# Patient Record
Sex: Male | Born: 1970 | Race: White | Hispanic: No
Health system: Southern US, Community
[De-identification: ages and names within clinical notes are randomized; demographics above are authoritative.]

## PROBLEM LIST (undated history)

## (undated) DIAGNOSIS — M109 Gout, unspecified: Secondary | ICD-10-CM

---

## 2005-06-22 ENCOUNTER — Emergency Department (HOSPITAL_COMMUNITY): Admission: EM | Admit: 2005-06-22 | Discharge: 2005-06-22 | Payer: Self-pay | Admitting: Emergency Medicine

## 2006-05-06 IMAGING — CT CT ABDOMEN W/O CM
1 series · 15 of 32 positions shown, 19 images · non-contrast
Comparison: none

CLINICAL DATA: Left flank pain and hematuria.
 ABDOMEN CT WITHOUT CONTRAST ? 06/22/05:
TECHNIQUE: Multidetector CT imaging of the abdomen was performed following the standard protocol without IV contrast.
TECHNIQUE: Multidetector CT imaging of the pelvis was performed following the standard protocol without IV contrast.

[Series 2: stone_wo 5.0 b40f st · axial · 0.70mm/px · z∈[-503,-91]mm · 15 of 115 slices shown, 19 images]
[im 8/115  soft-tissue]
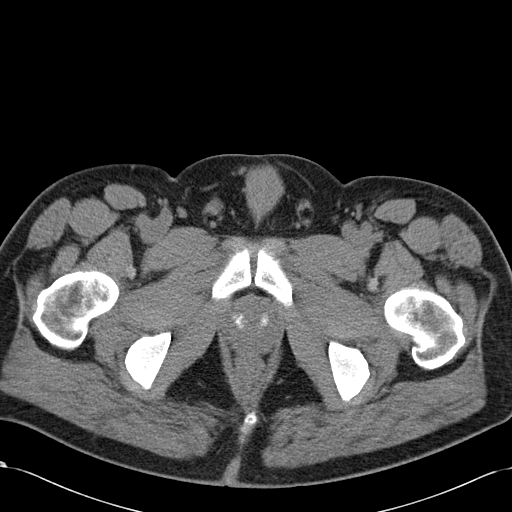
[im 8/115  bone]
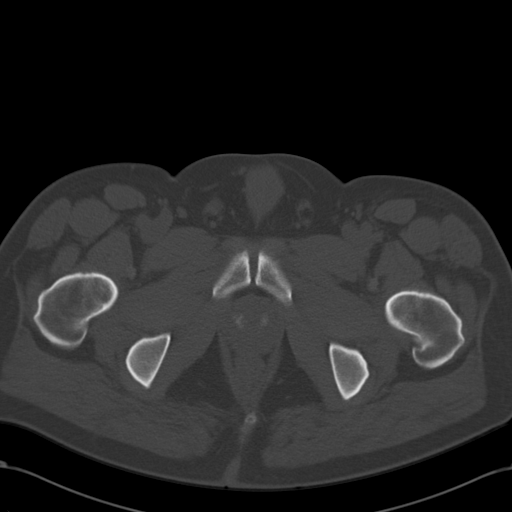
[im 15/115  soft-tissue]
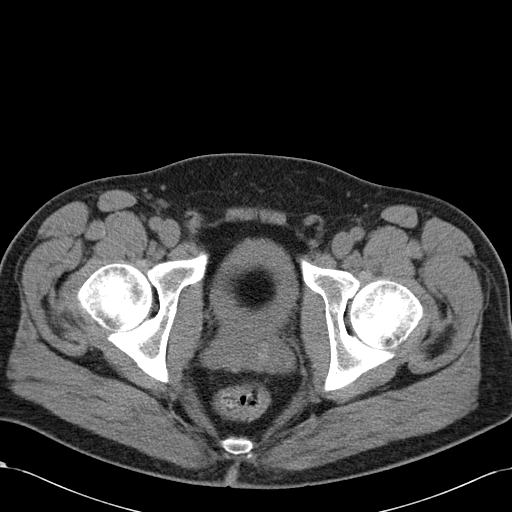
[im 23/115  soft-tissue]
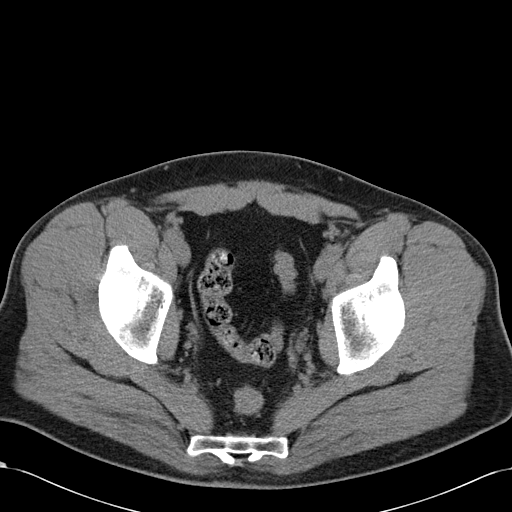
[im 34/115  soft-tissue]
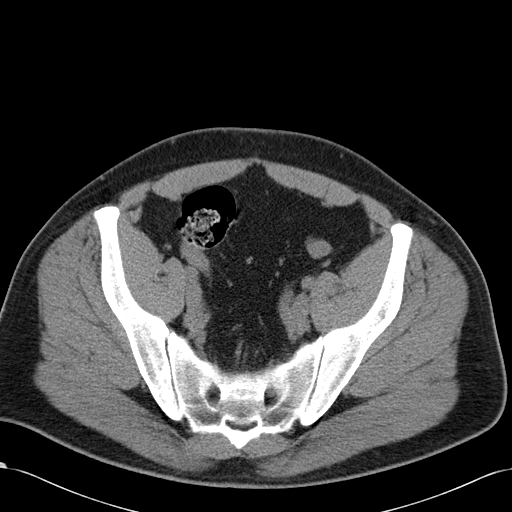
[im 41/115  soft-tissue]
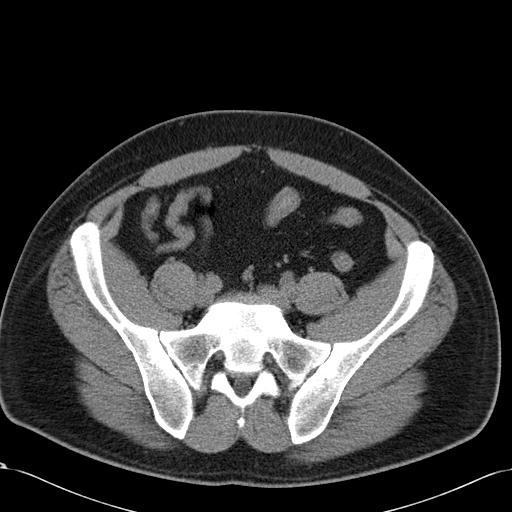
[im 48/115  soft-tissue]
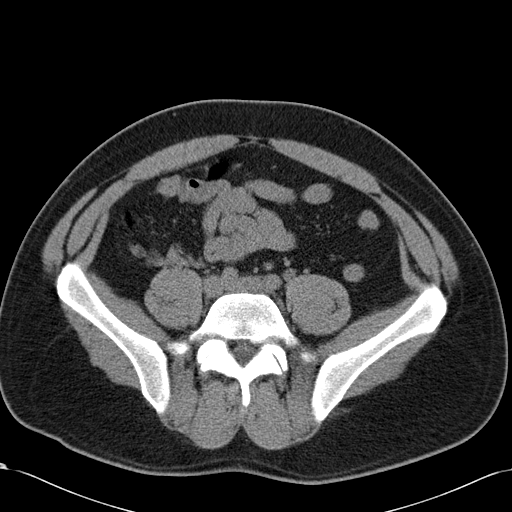
[im 59/115  soft-tissue]
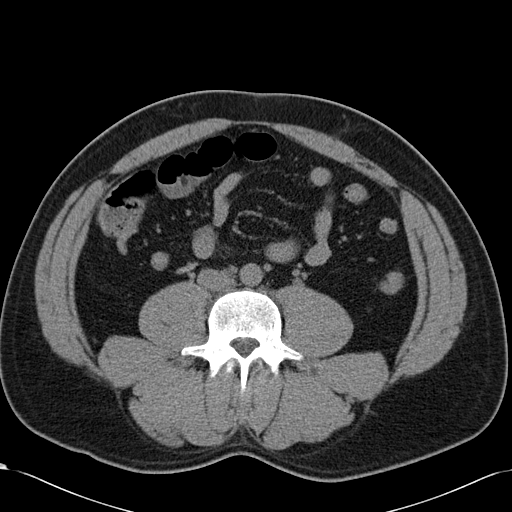
[im 67/115  soft-tissue]
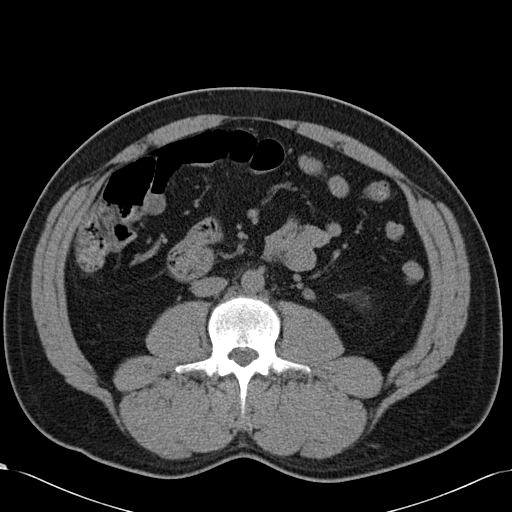
[im 74/115  soft-tissue]
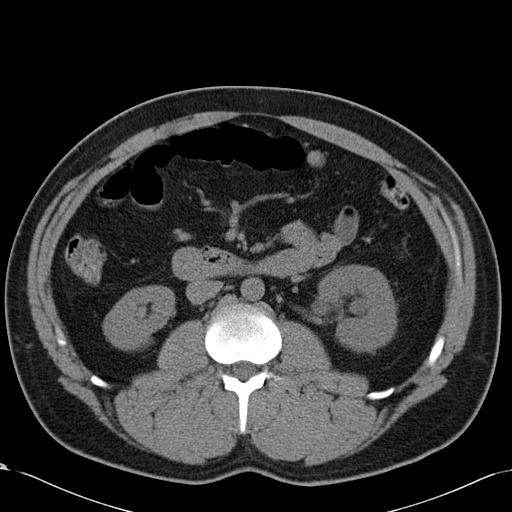
[im 74/115  bone]
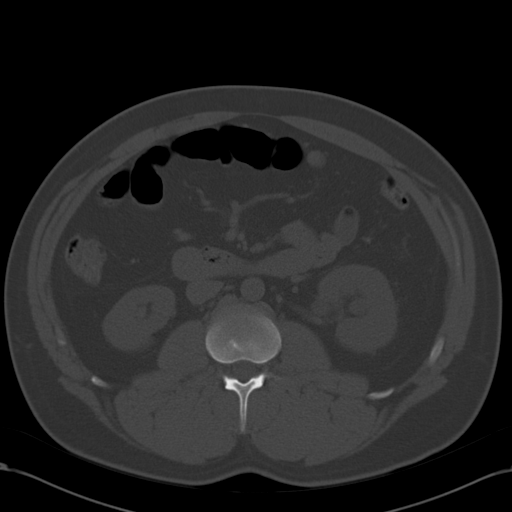
[im 81/115  soft-tissue]
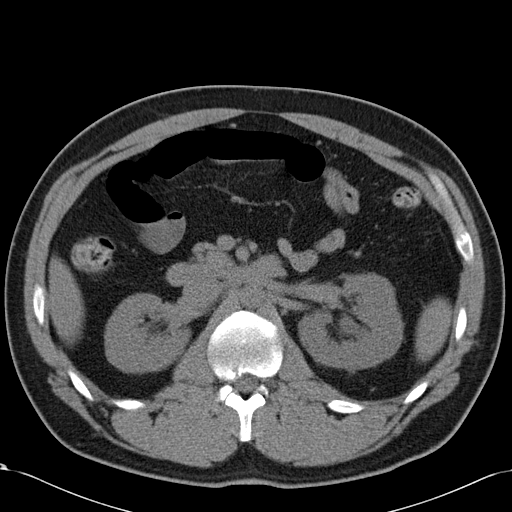
[im 92/115  soft-tissue]
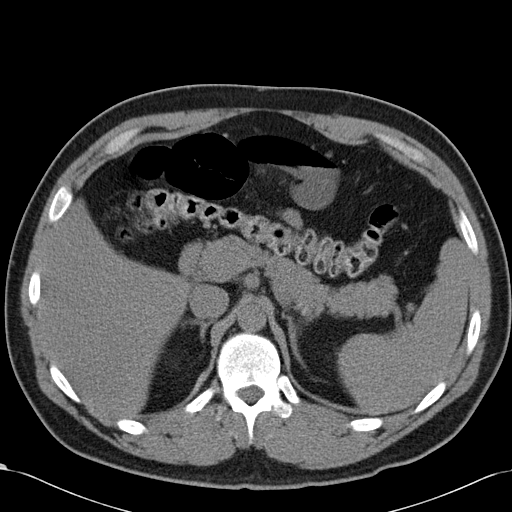
[im 100/115  soft-tissue]
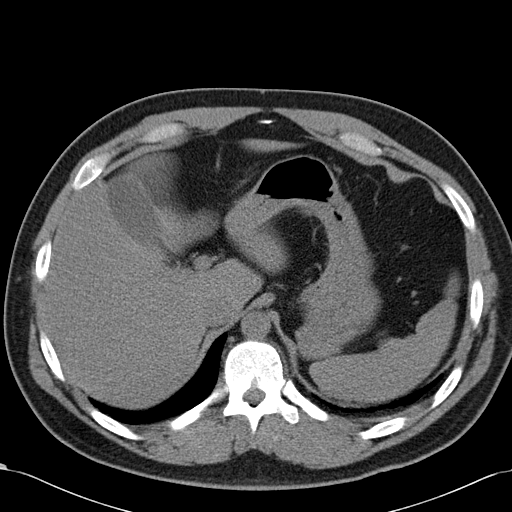
[im 100/115  lung]
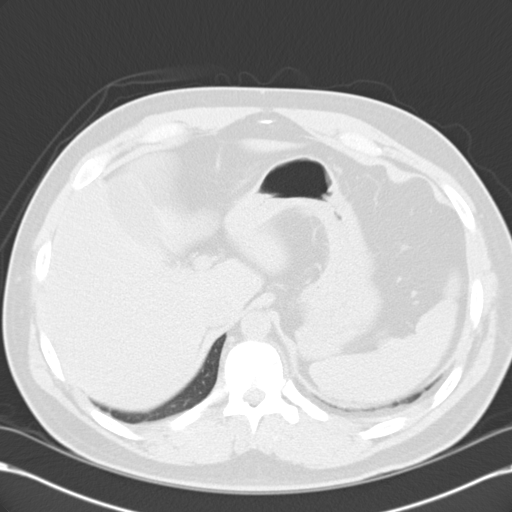
[im 103/115  lung]
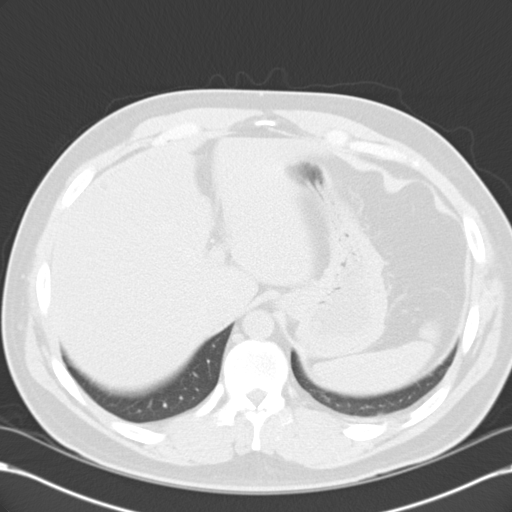
[im 107/115  soft-tissue]
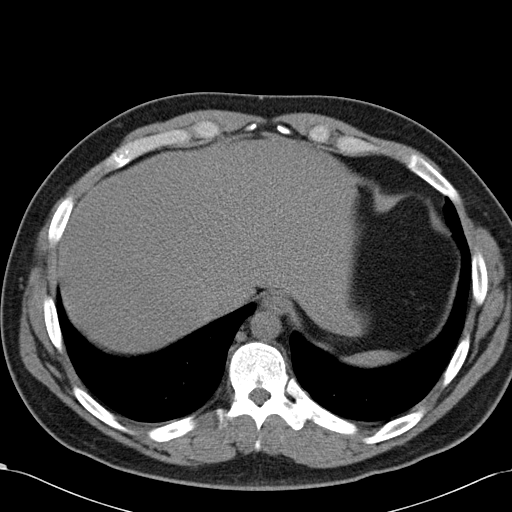
[im 107/115  lung]
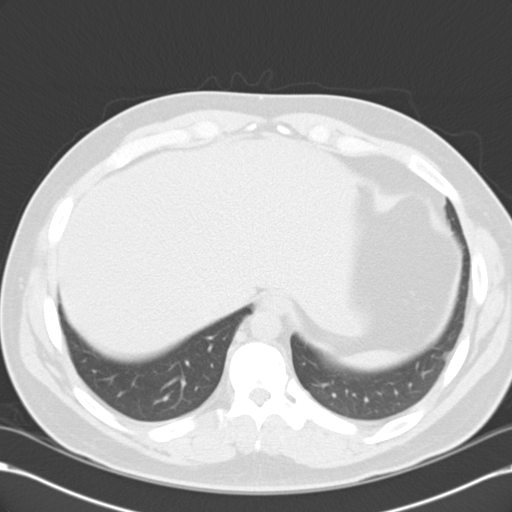
[im 111/115  lung]
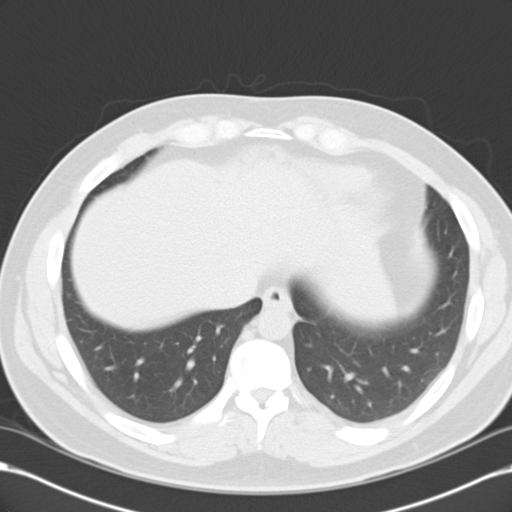

[15 of 32 positions shown; findings below may reference images not displayed]

FINDINGS: The visualized lung bases are clear.  There is no pleural or pericardial effusion.  A focal area of low attenuation adjacent to the gallbladder fossa measures 9.3 mm and is too small to reliably characterize (image #13).
 The spleen is negative. 
 The pancreas is negative.
 The right kidney is negative.
 The left kidney has mild to moderate hydronephrosis.  No nephrolithiasis is identified. 
 Negative for lymphadenopathy.  No free fluid.
 The appendix is negative.
IMPRESSION: Mild to moderate left hydronephrosis.
 PELVIS CT WITHOUT CONTRAST ? 06/22/05:
FINDINGS: A 5.1 mm stone is identified within the distal left ureter just proximal to the ureteropelvic junction.  No free pelvic fluid.  No pelvic lymphadenopathy.
 Review of bone windows demonstrates no lytic or sclerotic lesions.
IMPRESSION: 5.1 mm left distal ureteral stone with mild to moderate left-sided hydronephrosis.

## 2017-08-23 ENCOUNTER — Encounter (HOSPITAL_COMMUNITY): Payer: Self-pay | Admitting: Emergency Medicine

## 2017-08-23 ENCOUNTER — Emergency Department (HOSPITAL_COMMUNITY)
Admission: EM | Admit: 2017-08-23 | Discharge: 2017-08-23 | Disposition: A | Discharge status: Home / Self Care | Payer: Self-pay | Attending: Emergency Medicine | Admitting: Emergency Medicine

## 2017-08-23 ENCOUNTER — Emergency Department (HOSPITAL_COMMUNITY): Payer: Self-pay

## 2017-08-23 DIAGNOSIS — R112 Nausea with vomiting, unspecified: Secondary | ICD-10-CM | POA: Insufficient documentation

## 2017-08-23 DIAGNOSIS — N23 Unspecified renal colic: Secondary | ICD-10-CM | POA: Insufficient documentation

## 2017-08-23 DIAGNOSIS — Z79899 Other long term (current) drug therapy: Secondary | ICD-10-CM | POA: Insufficient documentation

## 2017-08-23 DIAGNOSIS — R7989 Other specified abnormal findings of blood chemistry: Secondary | ICD-10-CM | POA: Insufficient documentation

## 2017-08-23 HISTORY — DX: Gout, unspecified: M10.9

## 2017-08-23 LAB — URINALYSIS, ROUTINE W REFLEX MICROSCOPIC
Bilirubin Urine: NEGATIVE
GLUCOSE, UA: NEGATIVE mg/dL
Ketones, ur: NEGATIVE mg/dL
Leukocytes, UA: NEGATIVE
NITRITE: NEGATIVE
PROTEIN: NEGATIVE mg/dL
SPECIFIC GRAVITY, URINE: 1.014 (ref 1.005–1.030)
pH: 9 — ABNORMAL HIGH (ref 5.0–8.0)

## 2017-08-23 LAB — COMPREHENSIVE METABOLIC PANEL
ALBUMIN: 4.9 g/dL (ref 3.5–5.0)
ALK PHOS: 59 U/L (ref 38–126)
ALT: 51 U/L (ref 17–63)
ANION GAP: 15 (ref 5–15)
AST: 35 U/L (ref 15–41)
BILIRUBIN TOTAL: 1.1 mg/dL (ref 0.3–1.2)
BUN: 12 mg/dL (ref 6–20)
CALCIUM: 9.9 mg/dL (ref 8.9–10.3)
CO2: 22 mmol/L (ref 22–32)
CREATININE: 1.97 mg/dL — AB (ref 0.61–1.24)
Chloride: 102 mmol/L (ref 101–111)
GFR calc Af Amer: 46 mL/min — ABNORMAL LOW (ref >60)
GFR calc non Af Amer: 39 mL/min — ABNORMAL LOW (ref >60)
GLUCOSE: 129 mg/dL — AB (ref 65–99)
Potassium: 4.5 mmol/L (ref 3.5–5.1)
SODIUM: 139 mmol/L (ref 135–145)
TOTAL PROTEIN: 7.7 g/dL (ref 6.5–8.1)

## 2017-08-23 LAB — CBC
HCT: 50.2 % (ref 39.0–52.0)
HEMOGLOBIN: 17.7 g/dL — AB (ref 13.0–17.0)
MCH: 32.2 pg (ref 26.0–34.0)
MCHC: 35.3 g/dL (ref 30.0–36.0)
MCV: 91.4 fL (ref 78.0–100.0)
PLATELETS: 271 10*3/uL (ref 150–400)
RBC: 5.49 MIL/uL (ref 4.22–5.81)
RDW: 13.7 % (ref 11.5–15.5)
WBC: 13.7 10*3/uL — ABNORMAL HIGH (ref 4.0–10.5)

## 2017-08-23 LAB — LIPASE, BLOOD: Lipase: 45 U/L (ref 11–51)

## 2017-08-23 MED ORDER — TAMSULOSIN HCL 0.4 MG PO CAPS: 0.4000 mg | ORAL_CAPSULE | Freq: Every day | ORAL | 0 refills | Status: AC

## 2017-08-23 MED ORDER — CEPHALEXIN 500 MG PO CAPS: 500.0000 mg | ORAL_CAPSULE | Freq: Four times a day (QID) | ORAL | 0 refills | Status: AC

## 2017-08-23 MED ORDER — IOPAMIDOL (ISOVUE-300) INJECTION 61%
INTRAVENOUS | Status: AC
  Administered 2017-08-23: 100 mL
  Filled 2017-08-23: qty 75

## 2017-08-23 MED ORDER — OXYCODONE-ACETAMINOPHEN 5-325 MG PO TABS: 2.0000 | ORAL_TABLET | ORAL | 0 refills | Status: AC | PRN

## 2017-08-23 MED ORDER — MORPHINE SULFATE (PF) 4 MG/ML IV SOLN
4.0000 mg | Freq: Once | INTRAVENOUS | Status: AC
  Administered 2017-08-23: 4 mg via INTRAVENOUS
  Filled 2017-08-23: qty 1

## 2017-08-23 MED ORDER — ONDANSETRON HCL 4 MG/2ML IJ SOLN
4.0000 mg | Freq: Once | INTRAMUSCULAR | Status: AC
  Administered 2017-08-23: 4 mg via INTRAVENOUS
  Filled 2017-08-23: qty 2

## 2017-08-23 NOTE — ED Triage Notes (Signed)
Pt reports onset of LLQ abdominal pain that came on yesterday and then went away. Began having n/v. Today pain came back. Pt appears distressed due to pain. Denies recent fever, diarrhea.

## 2017-08-23 NOTE — Discharge Instructions (Signed)
Call Dr. Margrett Rudttelin's office today for follow-up as soon as possible Drink plenty of fluids. Take medications as prescribed. Return if worse, especially fever, inability to tolerate fluids, or pain that is uncontrolled. You must have your creatinine rechecked within the next week.

## 2017-08-23 NOTE — ED Provider Notes (Signed)
[facility] EMERGENCY DEPARTMENT Provider Note   CSN: [id number] Arrival date & time: 08/23/17  1154     History   Chief Complaint Chief Complaint  Patient presents with  . Abdominal Pain  . Emesis    HPI BILAAL LEIB is a 46 y.o. male.  HPI  46 year old man history of gout began having left sided lower abdominal pain that awoke him from sleep at about 5 AM. The pain is severe. It is sharp and crampy in nature. He had a similar episode of pain yesterday morning that resolved on its own. He became nauseated and vomited. He last ate yesterday morning but states that he did not eat during the rest of the day as per his usual habit. He has not noted any fever, chills, diarrhea, hematuria, or urinary frequency. He has not had similar symptoms in the past. He has not taken any medication.  Past Medical History:  Diagnosis Date  . Gout     There are no active problems to display for this patient.   No past surgical history on file.     Home Medications    Prior to Admission medications   Medication Sig Start Date End Date Taking? Authorizing Provider  allopurinol (ZYLOPRIM) 100 MG tablet Take 200 mg by mouth daily.   Yes [provider]    Family History No family history on file.  Social History Social History  Substance Use Topics  . Smoking status: Not on file  . Smokeless tobacco: Not on file  . Alcohol use Not on file     Allergies   Patient has no known allergies.   Review of Systems Review of Systems  All other systems reviewed and are negative.    Physical Exam Updated Vital Signs BP (!) 136/96   Pulse 75   Temp 97.8 F (36.6 C) (Oral)   Resp (!) 23   Ht 1.803 m (5\' 11" )   Wt 99.8 kg (220 lb)   SpO2 100%   BMI 30.68 kg/m   Physical Exam  Constitutional: He appears well-developed and well-nourished.  Appears uncomfortable  HENT:  Head: Normocephalic.  Right Ear: External ear normal.  Left Ear: External  ear normal.  Eyes: Pupils are equal, round, and reactive to light.  Neck: Normal range of motion.  Cardiovascular: Normal rate and regular rhythm.   Pulmonary/Chest: Effort normal and breath sounds normal.  Abdominal: Soft. Bowel sounds are normal. He exhibits no mass. There is tenderness. There is no rebound and no guarding. No hernia.  ttp left sided abdominal pain  Musculoskeletal: Normal range of motion.  Neurological: He is alert.  Skin: Skin is warm. Capillary refill takes less than 2 seconds.  Psychiatric: He has a normal mood and affect.  Nursing note and vitals reviewed.    ED Treatments / Results  Labs (all labs ordered are listed, but only abnormal results are displayed) Labs Reviewed  COMPREHENSIVE METABOLIC PANEL - Abnormal; Notable for the following:       Result Value   Glucose, Bld 129 (*)    Creatinine, Ser 1.97 (*)    GFR calc non Af Amer 39 (*)    GFR calc Af Amer 46 (*)    All other components within normal limits  CBC - Abnormal; Notable for the following:    WBC 13.7 (*)    Hemoglobin 17.7 (*)    All other components within normal limits  LIPASE, BLOOD  URINALYSIS, ROUTINE W REFLEX MICROSCOPIC  EKG  EKG Interpretation None       Radiology Ct Abdomen Pelvis W Contrast  Result Date: 08/23/2017 CLINICAL DATA:  46 year old male with left upper quadrant pain, nausea and vomiting EXAM: CT ABDOMEN AND PELVIS WITH CONTRAST TECHNIQUE: Multidetector CT imaging of the abdomen and pelvis was performed using the standard protocol following bolus administration of intravenous contrast. CONTRAST:  100mL ISOVUE-300 IOPAMIDOL (ISOVUE-300) INJECTION 61% COMPARISON:  Prior CT scan of the abdomen and pelvis 06/22/2005 FINDINGS: Lower chest: The lung bases are clear. Visualized cardiac structures are within normal limits for size. No pericardial effusion. Unremarkable visualized distal thoracic esophagus. Hepatobiliary: The liver is diffusely low in attenuation save  for some regions of sparing around the gallbladder fossa. The imaging characteristics are most consistent with hepatic steatosis. No discrete hepatic lesion is identified. Gallbladder is unremarkable. No intra or extrahepatic biliary ductal dilatation. Pancreas: Unremarkable. No pancreatic ductal dilatation or surrounding inflammatory changes. Spleen: Normal in size without focal abnormality. Adrenals/Urinary Tract: Mild left-sided hydronephrosis and hydroureter secondary to a 4 mm stone at the left ureterovesicular junction. Of note, similar findings were present on the prior study from 06/22/2005. No definite additional nephrolithiasis identified. Small circumscribed low-attenuation lesions in both kidneys are too small for accurate characterization but statistically highly likely benign cysts. Stomach/Bowel: No evidence of obstruction or focal bowel wall thickening. Normal appendix in the right lower quadrant. The terminal ileum is unremarkable. Vascular/Lymphatic: No significant vascular findings are present. No enlarged abdominal or pelvic lymph nodes. Reproductive: Prostate is unremarkable. Other: No abdominal wall hernia or abnormality. No abdominopelvic ascites. Musculoskeletal: No acute or significant osseous findings. L5-S1 degenerative disc disease. IMPRESSION: 1. Obstructing 4 mm stone at the left UVJ resulting in mild left-sided hydronephrosis. Of note, similar findings were present on the prior imaging from 06/22/2005. This presumably represents re- current stone disease. 2. Hepatic steatosis. 3. L5-S1 degenerative disc disease. Electronically Signed   By: Malachy MoanHeath  McCullough M.D.   On: 08/23/2017 14:56    Procedures Procedures (including critical care time)  Medications Ordered in ED Medications  morphine 4 MG/ML injection 4 mg (4 mg Intravenous Given 08/23/17 1332)  ondansetron (ZOFRAN) injection 4 mg (4 mg Intravenous Given 08/23/17 1332)     Initial Impression / Assessment and Plan / ED  Course  I have reviewed the triage vital signs and the nursing notes.  Pertinent labs & imaging results that were available during my care of the patient were reviewed by me and considered in my medical decision making (see chart for details).     3:15 PM Patient improved with pain down to 1. Discussed with Dr. Vernie Ammonsttelin. Given patient's normal anatomy on right, he will be treated as outpatient. He is given prescription for Flomax. 10 concern of the elevated creatinine. I have no baseline to compare. He will follow-up with Dr. Vernie Ammonsttelin, but understands that if for some reason he doesn't, he must have his creatinine rechecked, he is to take plenty of fluids  Final Clinical Impressions(s) / ED Diagnoses   Final diagnoses:  Ureteral colic  Elevated serum creatinine    New Prescriptions New Prescriptions   No medications on file     Margarita Grizzleay, Syrianna Schillaci, MD 08/24/17 1257

## 2017-08-23 NOTE — ED Notes (Signed)
Patient given discharge instructions and verbalized understanding.  Patient stable to discharge at this time.  Patient is alert and oriented to baseline.  No distressed noted at this time.  All belongings taken with the patient at discharge.   

## 2017-08-24 LAB — URINE CULTURE: CULTURE: NO GROWTH

## 2018-07-07 IMAGING — CT CT ABD-PELV W/ CM
2 of 5 series · 15 of 46 positions shown, 17 images · IV contrast (iopamidol)
Comparison: Prior CT scan of the abdomen and pelvis 06/22/2005

CLINICAL DATA: 46-year-old male with left upper quadrant pain,
nausea and vomiting

EXAM:
CT ABDOMEN AND PELVIS WITH CONTRAST
TECHNIQUE: Multidetector CT imaging of the abdomen and pelvis was performed
using the standard protocol following bolus administration of
intravenous contrast.
CONTRAST:  100mL 32Q7LN-LBB IOPAMIDOL (32Q7LN-LBB) INJECTION 61%

[Series 4: abd/ pelvis 5.0 i30f 2 · axial · 0.85mm/px · z∈[+598,+1093]mm · 12 of 111 slices shown, 14 images]
[im 6/111  soft-tissue]
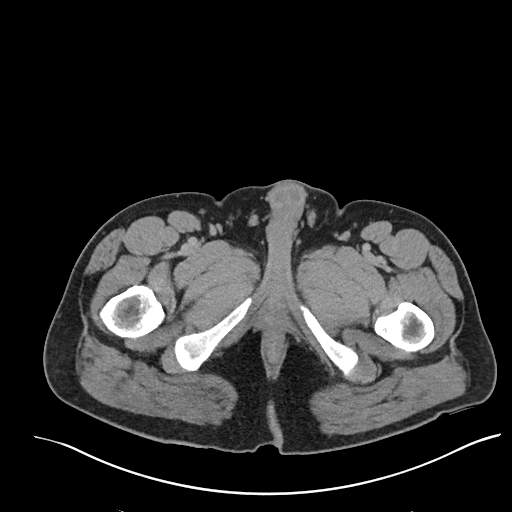
[im 6/111  bone]
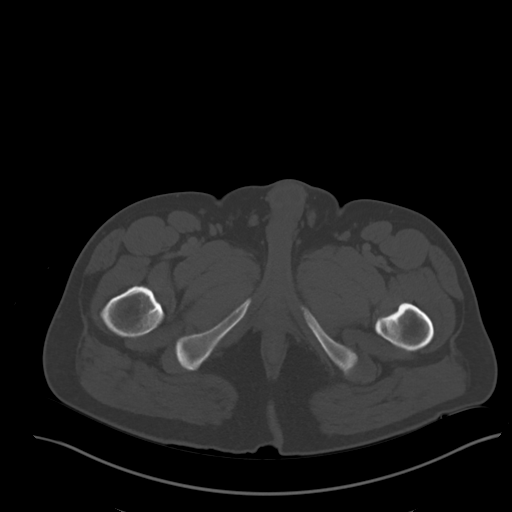
[im 18/111  soft-tissue]
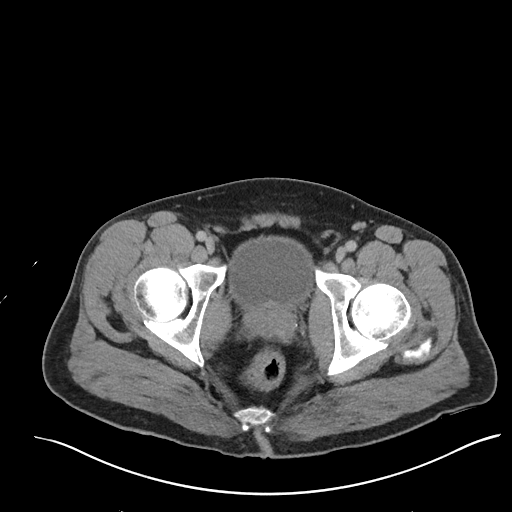
[im 24/111  soft-tissue]
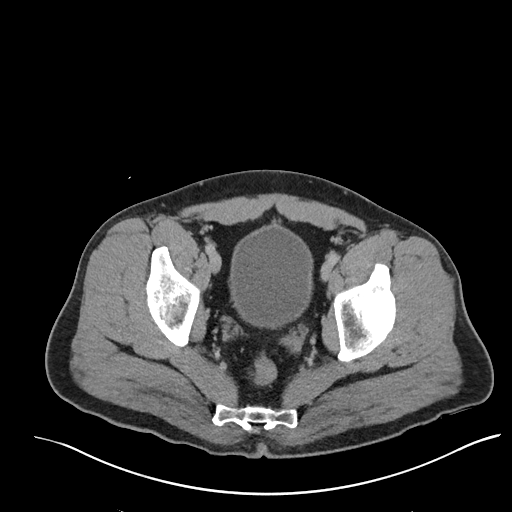
[im 35/111  soft-tissue]
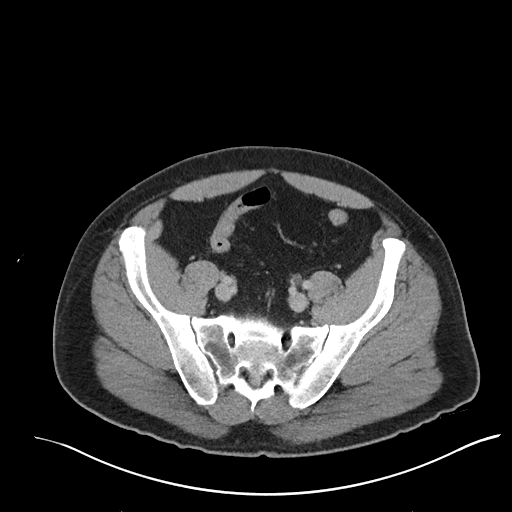
[im 41/111  soft-tissue]
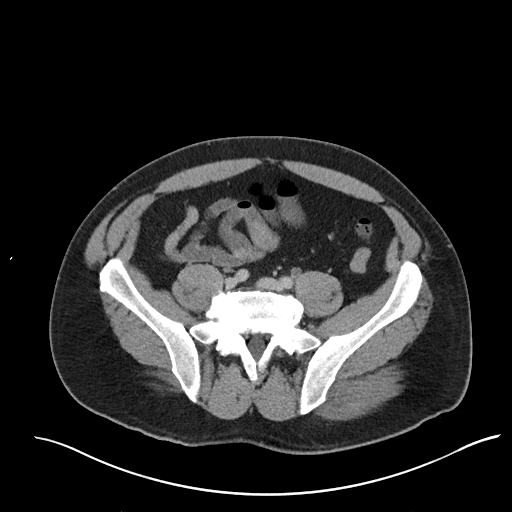
[im 53/111  soft-tissue]
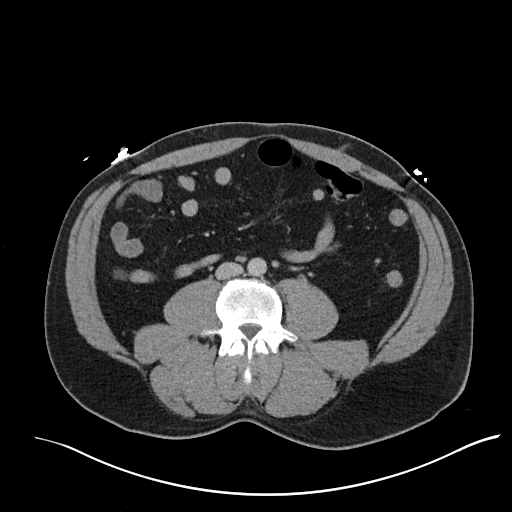
[im 58/111  soft-tissue]
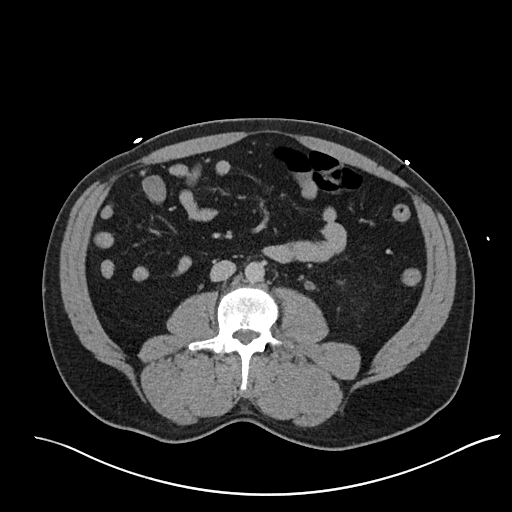
[im 70/111  soft-tissue]
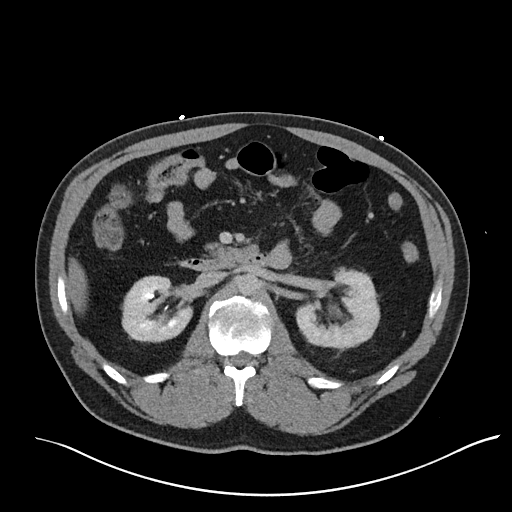
[im 76/111  soft-tissue]
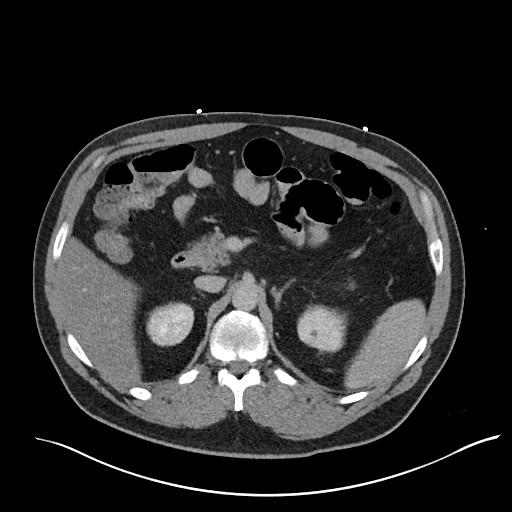
[im 76/111  bone]
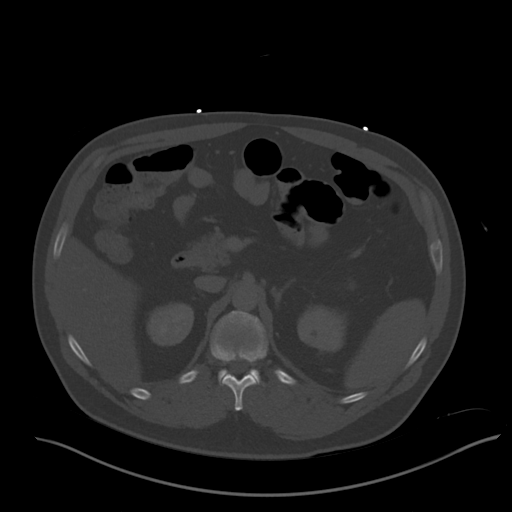
[im 87/111  soft-tissue]
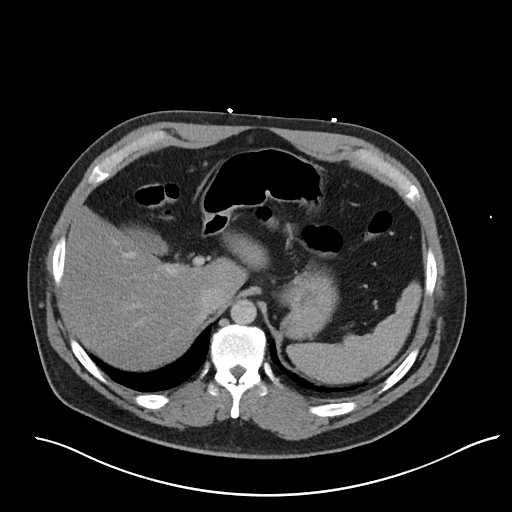
[im 93/111  soft-tissue]
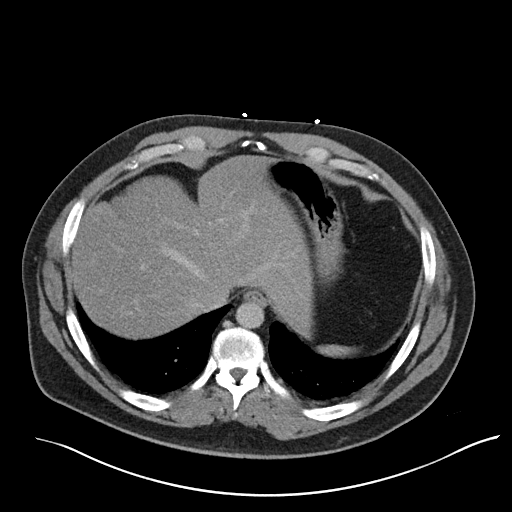
[im 105/111  soft-tissue]
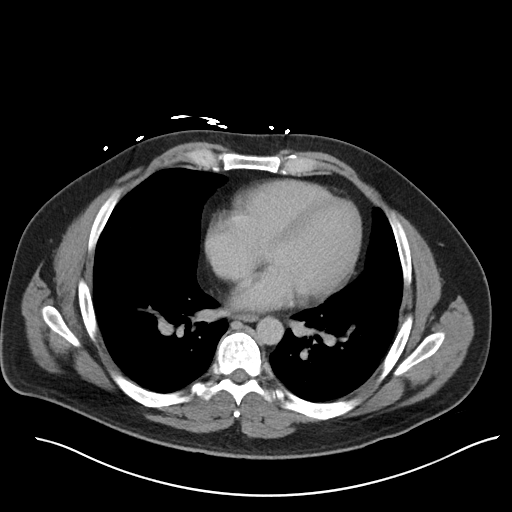

[Series 7: coronal soft tissue · coronal · 0.85mm/px · 3 of 93 slices shown]
[im 31/93  soft-tissue]
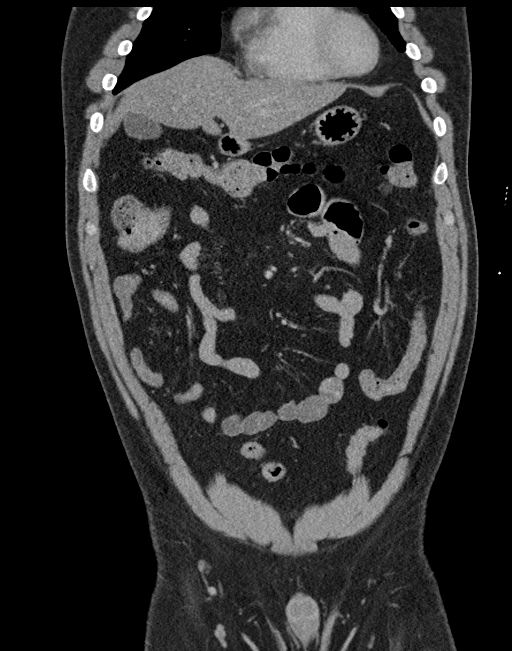
[im 41/93  soft-tissue]
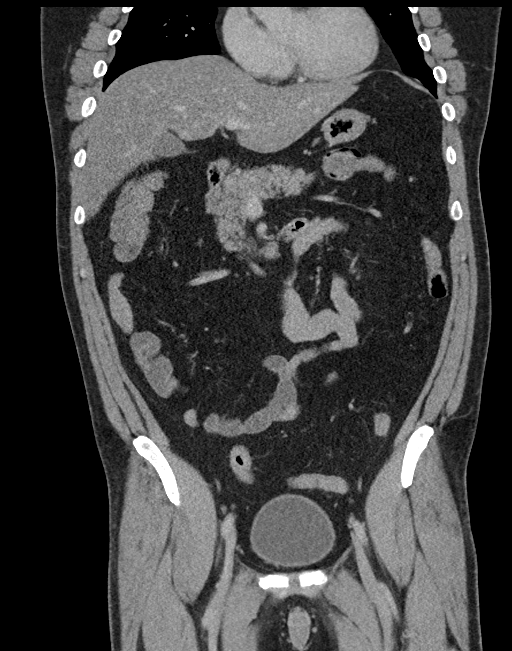
[im 52/93  soft-tissue]
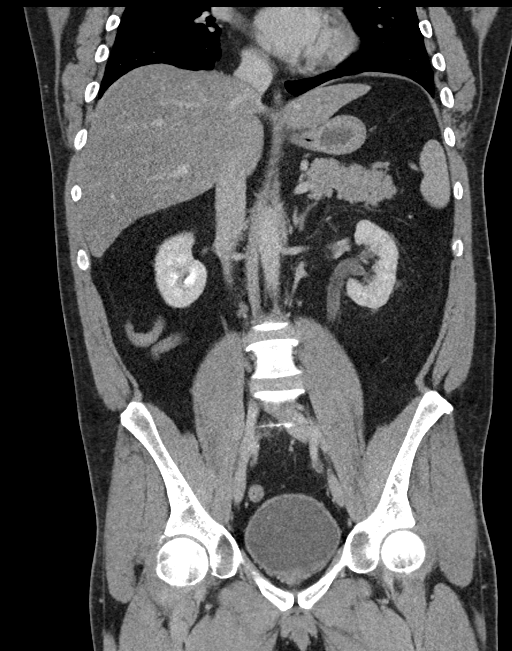

[15 of 46 positions shown; findings below may reference images not displayed]

FINDINGS: Lower chest: The lung bases are clear. Visualized cardiac structures
are within normal limits for size. No pericardial effusion.
Unremarkable visualized distal thoracic esophagus.

Hepatobiliary: The liver is diffusely low in attenuation save for
some regions of sparing around the gallbladder fossa. The imaging
characteristics are most consistent with hepatic steatosis. No
discrete hepatic lesion is identified. Gallbladder is unremarkable.
No intra or extrahepatic biliary ductal dilatation.

Pancreas: Unremarkable. No pancreatic ductal dilatation or
surrounding inflammatory changes.

Spleen: Normal in size without focal abnormality.

Adrenals/Urinary Tract: Mild left-sided hydronephrosis and
hydroureter secondary to a 4 mm stone at the left ureterovesicular
junction. Of note, similar findings were present on the prior study
from 06/22/2005. No definite additional nephrolithiasis identified.
Small circumscribed low-attenuation lesions in both kidneys are too
small for accurate characterization but statistically highly likely
benign cysts.

Stomach/Bowel: No evidence of obstruction or focal bowel wall
thickening. Normal appendix in the right lower quadrant. The
terminal ileum is unremarkable.

Vascular/Lymphatic: No significant vascular findings are present. No
enlarged abdominal or pelvic lymph nodes.

Reproductive: Prostate is unremarkable.

Other: No abdominal wall hernia or abnormality. No abdominopelvic
ascites.

Musculoskeletal: No acute or significant osseous findings. L5-S1
degenerative disc disease.
IMPRESSION: 1. Obstructing 4 mm stone at the left UVJ resulting in mild
left-sided hydronephrosis. Of note, similar findings were present on
the prior imaging from 06/22/2005. This presumably represents re-
current stone disease.
2. Hepatic steatosis.
3. L5-S1 degenerative disc disease.
